# Patient Record
Sex: Male | Born: 1985 | Race: White | Hispanic: No | Marital: Married | Smoking: Never smoker
Health system: Southern US, Community
[De-identification: ages and names within clinical notes are randomized; demographics above are authoritative.]

## PROBLEM LIST (undated history)

## (undated) DIAGNOSIS — Z973 Presence of spectacles and contact lenses: Secondary | ICD-10-CM

## (undated) DIAGNOSIS — Z87820 Personal history of traumatic brain injury: Secondary | ICD-10-CM

## (undated) DIAGNOSIS — K219 Gastro-esophageal reflux disease without esophagitis: Secondary | ICD-10-CM

## (undated) DIAGNOSIS — Z87438 Personal history of other diseases of male genital organs: Secondary | ICD-10-CM

## (undated) HISTORY — PX: TONSILLECTOMY: SUR1361

## (undated) HISTORY — PX: WISDOM TOOTH EXTRACTION: SHX21

## (undated) HISTORY — PX: ORCHIOPEXY: SHX479

---
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
## 2014-11-12 ENCOUNTER — Other Ambulatory Visit: Payer: Self-pay | Admitting: Orthopedic Surgery

## 2014-11-12 DIAGNOSIS — M25572 Pain in left ankle and joints of left foot: Secondary | ICD-10-CM

## 2014-11-13 ENCOUNTER — Ambulatory Visit
Admission: RE | Admit: 2014-11-13 | Discharge: 2014-11-13 | Disposition: A | Discharge status: Home / Self Care | Payer: 59 | Source: Ambulatory Visit | Attending: Orthopedic Surgery | Admitting: Orthopedic Surgery

## 2014-11-13 DIAGNOSIS — M25572 Pain in left ankle and joints of left foot: Secondary | ICD-10-CM

## 2015-01-07 ENCOUNTER — Other Ambulatory Visit (HOSPITAL_COMMUNITY): Payer: Self-pay | Admitting: Orthopedic Surgery

## 2015-01-10 ENCOUNTER — Encounter (HOSPITAL_COMMUNITY)
Admission: RE | Admit: 2015-01-10 | Discharge: 2015-01-10 | Disposition: A | Discharge status: Home / Self Care | Payer: Commercial Managed Care - HMO | Source: Ambulatory Visit | Attending: Orthopedic Surgery | Admitting: Orthopedic Surgery

## 2015-01-10 ENCOUNTER — Encounter (HOSPITAL_COMMUNITY): Payer: Self-pay

## 2015-01-10 DIAGNOSIS — Q6689 Other  specified congenital deformities of feet: Secondary | ICD-10-CM | POA: Diagnosis not present

## 2015-01-10 DIAGNOSIS — X58XXXA Exposure to other specified factors, initial encounter: Secondary | ICD-10-CM | POA: Diagnosis not present

## 2015-01-10 DIAGNOSIS — S92002A Unspecified fracture of left calcaneus, initial encounter for closed fracture: Secondary | ICD-10-CM | POA: Diagnosis present

## 2015-01-10 LAB — BASIC METABOLIC PANEL
Anion gap: 8 (ref 5–15)
BUN: 17 mg/dL (ref 6–20)
CO2: 24 mmol/L (ref 22–32)
Calcium: 9.2 mg/dL (ref 8.9–10.3)
Chloride: 108 mmol/L (ref 101–111)
Creatinine, Ser: 1.12 mg/dL (ref 0.61–1.24)
GFR calc Af Amer: >60 mL/min (ref >60)
Glucose, Bld: 104 mg/dL — ABNORMAL HIGH (ref 65–99)
Potassium: 4.2 mmol/L (ref 3.5–5.1)
SODIUM: 140 mmol/L (ref 135–145)

## 2015-01-10 LAB — CBC
HCT: 45 % (ref 39.0–52.0)
Hemoglobin: 16.1 g/dL (ref 13.0–17.0)
MCH: 31.5 pg (ref 26.0–34.0)
MCHC: 35.8 g/dL (ref 30.0–36.0)
MCV: 88.1 fL (ref 78.0–100.0)
Platelets: 237 10*3/uL (ref 150–400)
RBC: 5.11 MIL/uL (ref 4.22–5.81)
RDW: 12.7 % (ref 11.5–15.5)
WBC: 7.2 10*3/uL (ref 4.0–10.5)

## 2015-01-10 LAB — SURGICAL PCR SCREEN
MRSA, PCR: NEGATIVE
STAPHYLOCOCCUS AUREUS: POSITIVE — AB

## 2015-01-10 MED ORDER — CEFAZOLIN SODIUM-DEXTROSE 2-3 GM-% IV SOLR
2.0000 g | INTRAVENOUS | Status: AC
  Administered 2015-01-11: 2 g via INTRAVENOUS
  Filled 2015-01-10: qty 50

## 2015-01-10 NOTE — Pre-Procedure Instructions (Signed)
    Ricky Adams  01/10/2015      [id number] - [place], [place] - [address] [place] [address] Phone: [phone] Fax: [phone]    Your procedure is scheduled on January 11, 2015.  Report to [facility] Admitting at 10:50 A.M.  Call this number if you have problems the morning of surgery:  [phone]   Remember:  Do not eat food or drink liquids after midnight.  Take these medicines the morning of surgery with A SIP OF WATER    Do not wear jewelry, make-up or nail polish.  Do not wear lotions, powders, or perfumes.  You may wear deodorant.  Do not shave 48 hours prior to surgery.  Men may shave face and neck.  Do not bring valuables to the hospital.  [facility] is not responsible for any belongings or valuables.  Contacts, dentures or bridgework may not be worn into surgery.  Leave your suitcase in the car.  After surgery it may be brought to your room.  For patients admitted to the hospital, discharge time will be determined by your treatment team.  Patients discharged the day of surgery will not be allowed to drive home.   Name and phone number of your driver:    Special instructions:  "PREPARING FOR SURGERY"  Please read over the following fact sheets that you were given. Pain Booklet, Coughing and Deep Breathing and Surgical Site Infection Prevention

## 2015-01-11 ENCOUNTER — Ambulatory Visit (HOSPITAL_COMMUNITY): Payer: Commercial Managed Care - HMO | Admitting: Anesthesiology

## 2015-01-11 ENCOUNTER — Encounter (HOSPITAL_COMMUNITY): Payer: Self-pay | Admitting: *Deleted

## 2015-01-11 ENCOUNTER — Ambulatory Visit (HOSPITAL_COMMUNITY)
Admission: RE | Admit: 2015-01-11 | Discharge: 2015-01-11 | Disposition: A | Discharge status: Home / Self Care | Payer: Commercial Managed Care - HMO | Source: Ambulatory Visit | Attending: Orthopedic Surgery | Admitting: Orthopedic Surgery

## 2015-01-11 ENCOUNTER — Encounter (HOSPITAL_COMMUNITY)
Admission: RE | Disposition: A | Discharge status: Home / Self Care | Payer: Self-pay | Source: Ambulatory Visit | Attending: Orthopedic Surgery

## 2015-01-11 DIAGNOSIS — M24672 Ankylosis, left ankle: Secondary | ICD-10-CM

## 2015-01-11 DIAGNOSIS — S92002A Unspecified fracture of left calcaneus, initial encounter for closed fracture: Secondary | ICD-10-CM | POA: Insufficient documentation

## 2015-01-11 DIAGNOSIS — X58XXXA Exposure to other specified factors, initial encounter: Secondary | ICD-10-CM | POA: Insufficient documentation

## 2015-01-11 DIAGNOSIS — Q6689 Other  specified congenital deformities of feet: Secondary | ICD-10-CM | POA: Diagnosis not present

## 2015-01-11 HISTORY — PX: ANKLE FUSION: SHX881

## 2015-01-11 SURGERY — ARTHRODESIS ANKLE
Anesthesia: Regional | Site: Foot | Laterality: Left

## 2015-01-11 MED ORDER — LIDOCAINE HCL (CARDIAC) 20 MG/ML IV SOLN
INTRAVENOUS | Status: AC
  Filled 2015-01-11: qty 5

## 2015-01-11 MED ORDER — FENTANYL CITRATE (PF) 100 MCG/2ML IJ SOLN
INTRAMUSCULAR | Status: AC
  Administered 2015-01-11: 50 ug
  Filled 2015-01-11: qty 2

## 2015-01-11 MED ORDER — PROMETHAZINE HCL 25 MG/ML IJ SOLN: 6.2500 mg | INTRAMUSCULAR | Status: DC | PRN

## 2015-01-11 MED ORDER — HYDROMORPHONE HCL 1 MG/ML IJ SOLN: 0.2500 mg | INTRAMUSCULAR | Status: DC | PRN

## 2015-01-11 MED ORDER — OXYCODONE HCL 5 MG/5ML PO SOLN: 5.0000 mg | Freq: Once | ORAL | Status: DC | PRN

## 2015-01-11 MED ORDER — MIDAZOLAM HCL 2 MG/2ML IJ SOLN
INTRAMUSCULAR | Status: AC
  Filled 2015-01-11: qty 4

## 2015-01-11 MED ORDER — MIDAZOLAM HCL 2 MG/2ML IJ SOLN
INTRAMUSCULAR | Status: AC
  Administered 2015-01-11: 2 mg
  Filled 2015-01-11: qty 2

## 2015-01-11 MED ORDER — 0.9 % SODIUM CHLORIDE (POUR BTL) OPTIME
TOPICAL | Status: DC | PRN
  Administered 2015-01-11: 1000 mL

## 2015-01-11 MED ORDER — OXYCODONE HCL 5 MG PO TABS: 5.0000 mg | ORAL_TABLET | Freq: Once | ORAL | Status: DC | PRN

## 2015-01-11 MED ORDER — HYDROCODONE-ACETAMINOPHEN 5-325 MG PO TABS: 1.0000 | ORAL_TABLET | Freq: Four times a day (QID) | ORAL | Status: DC | PRN

## 2015-01-11 MED ORDER — PROPOFOL 10 MG/ML IV BOLUS
INTRAVENOUS | Status: AC
  Filled 2015-01-11: qty 20

## 2015-01-11 MED ORDER — LACTATED RINGERS IV SOLN
Freq: Once | INTRAVENOUS | Status: AC
  Administered 2015-01-11: 11:00:00 via INTRAVENOUS

## 2015-01-11 MED ORDER — LACTATED RINGERS IV SOLN
INTRAVENOUS | Status: DC | PRN
  Administered 2015-01-11: 13:00:00 via INTRAVENOUS

## 2015-01-11 MED ORDER — ONDANSETRON HCL 4 MG/2ML IJ SOLN
INTRAMUSCULAR | Status: AC
  Filled 2015-01-11: qty 2

## 2015-01-11 MED ORDER — FENTANYL CITRATE (PF) 250 MCG/5ML IJ SOLN
INTRAMUSCULAR | Status: AC
  Filled 2015-01-11: qty 5

## 2015-01-11 MED ORDER — PROPOFOL 10 MG/ML IV BOLUS
INTRAVENOUS | Status: DC | PRN
  Administered 2015-01-11: 50 mg via INTRAVENOUS
  Administered 2015-01-11: 150 mg via INTRAVENOUS

## 2015-01-11 MED ORDER — ONDANSETRON HCL 4 MG/2ML IJ SOLN
INTRAMUSCULAR | Status: DC | PRN
  Administered 2015-01-11: 4 mg via INTRAVENOUS

## 2015-01-11 MED ORDER — KETOROLAC TROMETHAMINE 30 MG/ML IJ SOLN: 30.0000 mg | Freq: Once | INTRAMUSCULAR | Status: DC | PRN

## 2015-01-11 MED ORDER — MIDAZOLAM HCL 5 MG/5ML IJ SOLN
INTRAMUSCULAR | Status: DC | PRN
  Administered 2015-01-11: 2 mg via INTRAVENOUS

## 2015-01-11 SURGICAL SUPPLY — 50 items
BANDAGE ESMARK 6X9 LF (GAUZE/BANDAGES/DRESSINGS) IMPLANT
BLADE SAW SGTL 83.5X18.5 (BLADE) IMPLANT
BLADE SURG 10 STRL SS (BLADE) IMPLANT
BNDG COHESIVE 4X5 TAN STRL (GAUZE/BANDAGES/DRESSINGS) ×3 IMPLANT
BNDG ESMARK 4X9 LF (GAUZE/BANDAGES/DRESSINGS) ×3 IMPLANT
BNDG ESMARK 6X9 LF (GAUZE/BANDAGES/DRESSINGS)
BNDG GAUZE ELAST 4 BULKY (GAUZE/BANDAGES/DRESSINGS) ×3 IMPLANT
COTTON STERILE ROLL (GAUZE/BANDAGES/DRESSINGS) IMPLANT
COVER MAYO STAND STRL (DRAPES) ×3 IMPLANT
COVER SURGICAL LIGHT HANDLE (MISCELLANEOUS) ×3 IMPLANT
CUFF TOURNIQUET SINGLE 34IN LL (TOURNIQUET CUFF) IMPLANT
CUFF TOURNIQUET SINGLE 44IN (TOURNIQUET CUFF) IMPLANT
DRAPE INCISE IOBAN 66X45 STRL (DRAPES) IMPLANT
DRAPE OEC MINIVIEW 54X84 (DRAPES) IMPLANT
DRAPE PROXIMA HALF (DRAPES) ×3 IMPLANT
DRAPE U-SHAPE 47X51 STRL (DRAPES) ×3 IMPLANT
DRSG ADAPTIC 3X8 NADH LF (GAUZE/BANDAGES/DRESSINGS) ×3 IMPLANT
DRSG PAD ABDOMINAL 8X10 ST (GAUZE/BANDAGES/DRESSINGS) ×3 IMPLANT
DURAPREP 26ML APPLICATOR (WOUND CARE) ×3 IMPLANT
ELECT REM PT RETURN 9FT ADLT (ELECTROSURGICAL) ×3
ELECTRODE REM PT RTRN 9FT ADLT (ELECTROSURGICAL) ×1 IMPLANT
GAUZE SPONGE 4X4 12PLY STRL (GAUZE/BANDAGES/DRESSINGS) ×3 IMPLANT
GLOVE BIOGEL PI IND STRL 6.5 (GLOVE) ×1 IMPLANT
GLOVE BIOGEL PI IND STRL 9 (GLOVE) ×1 IMPLANT
GLOVE BIOGEL PI INDICATOR 6.5 (GLOVE) ×2
GLOVE BIOGEL PI INDICATOR 9 (GLOVE) ×2
GLOVE SURG ORTHO 9.0 STRL STRW (GLOVE) ×3 IMPLANT
GLOVE SURG SS PI 6.5 STRL IVOR (GLOVE) ×3 IMPLANT
GOWN STRL REUS W/ TWL LRG LVL3 (GOWN DISPOSABLE) ×1 IMPLANT
GOWN STRL REUS W/ TWL XL LVL3 (GOWN DISPOSABLE) ×1 IMPLANT
GOWN STRL REUS W/TWL LRG LVL3 (GOWN DISPOSABLE) ×2
GOWN STRL REUS W/TWL XL LVL3 (GOWN DISPOSABLE) ×2
KIT BASIN OR (CUSTOM PROCEDURE TRAY) ×3 IMPLANT
KIT ROOM TURNOVER OR (KITS) ×3 IMPLANT
MANIFOLD NEPTUNE II (INSTRUMENTS) IMPLANT
NS IRRIG 1000ML POUR BTL (IV SOLUTION) ×3 IMPLANT
PACK ORTHO EXTREMITY (CUSTOM PROCEDURE TRAY) ×3 IMPLANT
PAD ARMBOARD 7.5X6 YLW CONV (MISCELLANEOUS) ×3 IMPLANT
PAD CAST 4YDX4 CTTN HI CHSV (CAST SUPPLIES) IMPLANT
PADDING CAST COTTON 4X4 STRL (CAST SUPPLIES)
SPONGE LAP 18X18 X RAY DECT (DISPOSABLE) IMPLANT
STAPLER VISISTAT 35W (STAPLE) IMPLANT
SUCTION FRAZIER TIP 10 FR DISP (SUCTIONS) ×3 IMPLANT
SUT ETHILON 2 0 PSLX (SUTURE) ×3 IMPLANT
SUT VIC AB 2-0 CTB1 (SUTURE) IMPLANT
TOWEL OR 17X24 6PK STRL BLUE (TOWEL DISPOSABLE) IMPLANT
TOWEL OR 17X26 10 PK STRL BLUE (TOWEL DISPOSABLE) ×3 IMPLANT
TUBE CONNECTING 12'X1/4 (SUCTIONS) ×1
TUBE CONNECTING 12X1/4 (SUCTIONS) ×2 IMPLANT
WATER STERILE IRR 1000ML POUR (IV SOLUTION) IMPLANT

## 2015-01-11 NOTE — Progress Notes (Signed)
Orthopedic Tech Progress Note Patient Details:  Ricky Adams [facility] 1985/10/20 [id number]  Ortho Devices Type of Ortho Device: Postop shoe/boot, Crutches Ortho Device/Splint Location: lle Ortho Device/Splint Interventions: Application   [name] 01/11/2015, 2:25 PM

## 2015-01-11 NOTE — Transfer of Care (Signed)
Immediate Anesthesia Transfer of Care Note  Patient: Ricky Adams  Procedure(s) Performed: Procedure(s): Excision Left Subtalar Bar (Left)  Patient Location: PACU  Anesthesia Type:General  Level of Consciousness: awake, alert  and oriented  Airway & Oxygen Therapy: Patient Spontanous Breathing and Patient connected to nasal cannula oxygen  Post-op Assessment: Report given to RN  Post vital signs: Reviewed and stable  Last Vitals:  Filed Vitals:   01/11/15 1219  BP:   Pulse: 82  Temp:   Resp: 13    Complications: No apparent anesthesia complications

## 2015-01-11 NOTE — Progress Notes (Signed)
Orthopedic Tech Progress Note Patient Details:  Ricky Adams 08/08/1985 [id number]  Patient ID: Ricky Adams, male   DOB: 05-09-1986, 29 y.o.   MRN: [id number] Viewed order from doctor's order list  Nikki Dom 01/11/2015, 2:26 PM

## 2015-01-11 NOTE — Anesthesia Procedure Notes (Addendum)
Anesthesia Regional Block:  Popliteal block  Pre-Anesthetic Checklist: ,, timeout performed, Correct Patient, Correct Site, Correct Laterality, Correct Procedure, Correct Position, site marked, Risks and benefits discussed,  Surgical consent,  Pre-op evaluation,  At surgeon's request and post-op pain management  Laterality: Left  Prep: chloraprep       Needles:  Injection technique: Single-shot  Needle Type: Echogenic Stimulator Needle     Needle Length: 9cm 9 cm Needle Gauge: 21 and 21 G    Additional Needles:  Procedures: ultrasound guided (picture in chart) and nerve stimulator Popliteal block  Nerve Stimulator or Paresthesia:  Response: tibial, 0.5 mA,  Response: peroneal 0.5,   Additional Responses:   Narrative:  Start time: 01/11/2015 11:55 AM End time: 01/11/2015 12:03 PM Injection made incrementally with aspirations every 5 mL.  Performed by: Personally  Anesthesiologist: Marcene Duos  Additional Notes: Risks and benefits discussed. Pt tolerated well with no immediate complications.   Procedure Name: LMA Insertion Date/Time: 01/11/2015 12:48 PM Performed by: Jefm Miles E Pre-anesthesia Checklist: Patient identified, Emergency Drugs available, Suction available, Patient being monitored and Timeout performed Patient Re-evaluated:Patient Re-evaluated prior to inductionOxygen Delivery Method: Circle system utilized Preoxygenation: Pre-oxygenation with 100% oxygen Intubation Type: IV induction Ventilation: Mask ventilation without difficulty LMA: LMA inserted LMA Size: 5.0 Number of attempts: 1 Placement Confirmation: positive ETCO2 and breath sounds checked- equal and bilateral Tube secured with: Tape Dental Injury: Teeth and Oropharynx as per pre-operative assessment

## 2015-01-11 NOTE — H&P (Signed)
Ricky Adams is an 29 y.o. male.   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  No past medical history on file.  Past Surgical History  Procedure Laterality Date  . Wisdom tooth extraction    . Tonsillectomy      No family history on file. Social History:  has no tobacco, alcohol, and drug history on file.  Allergies: No Known Allergies  No prescriptions prior to admission    Results for orders placed or performed during the hospital encounter of 01/10/15 (from the past 48 hour(s))  CBC     Status: None   Collection Time: 01/10/15  3:35 PM  Result Value Ref Range   WBC 7.2 4.0 - 10.5 K/uL   RBC 5.11 4.22 - 5.81 MIL/uL   Hemoglobin 16.1 13.0 - 17.0 g/dL   HCT 45.0 39.0 - 52.0 %   MCV 88.1 78.0 - 100.0 fL   MCH 31.5 26.0 - 34.0 pg   MCHC 35.8 30.0 - 36.0 g/dL   RDW 12.7 11.5 - 15.5 %   Platelets 237 150 - 400 K/uL  Basic metabolic panel     Status: Abnormal   Collection Time: 01/10/15  3:35 PM  Result Value Ref Range   Sodium 140 135 - 145 mmol/L   Potassium 4.2 3.5 - 5.1 mmol/L   Chloride 108 101 - 111 mmol/L   CO2 24 22 - 32 mmol/L   Glucose, Bld 104 (H) 65 - 99 mg/dL   BUN 17 6 - 20 mg/dL   Creatinine, Ser 1.12 0.61 - 1.24 mg/dL   Calcium 9.2 8.9 - 10.3 mg/dL   GFR calc non Af Amer >60 >60 mL/min   GFR calc Af Amer >60 >60 mL/min    Comment: (NOTE) The eGFR has been calculated using the CKD EPI equation. This calculation has not been validated in all clinical situations. eGFR's persistently <60 mL/min signify possible Chronic Kidney Disease.    Anion gap 8 5 - 15  Surgical pcr screen     Status: Abnormal   Collection Time: 01/10/15  3:35 PM  Result Value Ref Range   MRSA, PCR NEGATIVE NEGATIVE   Staphylococcus aureus POSITIVE (A) NEGATIVE    Comment:        The Xpert SA Assay  (FDA approved for NASAL specimens in patients over 4 years of age), is one component of a comprehensive surveillance program.  Test performance has been validated by [facility] for patients greater than or equal to 67 year old. It is not intended to diagnose infection nor to guide or monitor treatment.    No results found.  Review of Systems  All other systems reviewed and are negative.   There were no vitals taken for this visit. Physical Exam  Patient has essentially no subtalar motion. Review of radiographs and CT scan shows a subtalar bar. Assessment/Plan Assessment: Painful subtalar bar left foot.  Plan: We'll plan for excision of the subtalar bar left foot.  Ricky Adams V 01/11/2015, 6:15 AM

## 2015-01-11 NOTE — Anesthesia Preprocedure Evaluation (Addendum)
Anesthesia Evaluation  Patient identified by MRN, date of birth, ID band Patient awake    Reviewed: Allergy & Precautions, NPO status , Patient's Chart, lab work & pertinent test results  Airway Mallampati: I  TM Distance: >3 FB Neck ROM: Full    Dental  (+) Teeth Intact, Dental Advisory Given   Pulmonary neg pulmonary ROS,  breath sounds clear to auscultation        Cardiovascular negative cardio ROS  Rhythm:Regular Rate:Normal     Neuro/Psych negative neurological ROS     GI/Hepatic negative GI ROS, Neg liver ROS,   Endo/Other  negative endocrine ROS  Renal/GU negative Renal ROS     Musculoskeletal   Abdominal   Peds  Hematology negative hematology ROS (+)   Anesthesia Other Findings   Reproductive/Obstetrics                            Anesthesia Physical Anesthesia Plan  ASA: I  Anesthesia Plan: General and Regional   Post-op Pain Management: GA combined w/ Regional for post-op pain   Induction: Intravenous  Airway Management Planned: LMA  Additional Equipment:   Intra-op Plan:   Post-operative Plan:   Informed Consent: I have reviewed the patients History and Physical, chart, labs and discussed the procedure including the risks, benefits and alternatives for the proposed anesthesia with the patient or authorized representative who has indicated his/her understanding and acceptance.     Plan Discussed with: CRNA  Anesthesia Plan Comments:         Anesthesia Quick Evaluation

## 2015-01-11 NOTE — Anesthesia Postprocedure Evaluation (Signed)
  Anesthesia Post-op Note  Patient: Ricky Adams  Procedure(s) Performed: Procedure(s): Excision Left Subtalar Bar (Left)  Patient Location: PACU  Anesthesia Type:GA combined with regional for post-op pain  Level of Consciousness: awake, alert  and oriented  Airway and Oxygen Therapy: Patient Spontanous Breathing  Post-op Pain: none  Post-op Assessment: Post-op Vital signs reviewed              Post-op Vital Signs: Reviewed  Last Vitals:  Filed Vitals:   01/11/15 1415  BP: 129/95  Pulse: 74  Temp: 36.1 C  Resp: 16    Complications: No apparent anesthesia complications

## 2015-01-11 NOTE — Op Note (Signed)
01/11/2015  1:27 PM  PATIENT:  Ricky Adams    PRE-OPERATIVE DIAGNOSIS:  Calcaneonavicular Bar   POST-OPERATIVE DIAGNOSIS:  Same  PROCEDURE:  Excision Left Subtalar Bar  SURGEON:  DUDA,MARCUS V, MD  PHYSICIAN ASSISTANT:None ANESTHESIA:   General  PREOPERATIVE INDICATIONS:  Ricky Adams is a  29 y.o. male with a diagnosis of Calcaneonavicular Bar with Calcaneus Fracture who failed conservative measures and elected for surgical management.    The risks benefits and alternatives were discussed with the patient preoperatively including but not limited to the risks of infection, bleeding, nerve injury, cardiopulmonary complications, the need for revision surgery, among others, and the patient was willing to proceed.  OPERATIVE IMPLANTS: None  OPERATIVE FINDINGS: Bony fusion of the calcaneus to the navicular  OPERATIVE PROCEDURE: Patient was brought to the operating room and underwent a general and aesthetic. After adequate levels of anesthesia were obtained patient's left lower extremity was prepped using DuraPrep draped into a sterile field. A timeout was called. An oblique incision was made over the sinus Tarsi. The extensor muscles were elevated off the talar neck. The talar neck was identified and osteotome was used to resect the subtalar bar. After resection patient had good subtalar motion. The wound was irrigated with normal saline. The incision was closed using 2-0 nylon. A sterile compressive dressing was applied. Patient was extubated taken to the PACU in stable condition plan for discharge to home.

## 2015-01-12 ENCOUNTER — Encounter (HOSPITAL_COMMUNITY): Payer: Self-pay | Admitting: Orthopedic Surgery

## 2018-01-13 ENCOUNTER — Other Ambulatory Visit: Payer: Self-pay | Admitting: Urology

## 2018-01-22 ENCOUNTER — Encounter (HOSPITAL_BASED_OUTPATIENT_CLINIC_OR_DEPARTMENT_OTHER): Payer: Self-pay | Admitting: *Deleted

## 2018-01-24 ENCOUNTER — Encounter (HOSPITAL_BASED_OUTPATIENT_CLINIC_OR_DEPARTMENT_OTHER): Payer: Self-pay | Admitting: *Deleted

## 2018-01-24 ENCOUNTER — Other Ambulatory Visit: Payer: Self-pay

## 2018-01-24 NOTE — Progress Notes (Signed)
Spoke w/ pt via phone for pre-op interview.  Npo after mn w/ exception clear liquids until 0630 (no cream /milk products).  Arrive at 1030.   

## 2018-01-31 ENCOUNTER — Encounter (HOSPITAL_BASED_OUTPATIENT_CLINIC_OR_DEPARTMENT_OTHER)
Admission: RE | Disposition: A | Discharge status: Home / Self Care | Payer: Self-pay | Source: Ambulatory Visit | Attending: Urology

## 2018-01-31 ENCOUNTER — Ambulatory Visit (HOSPITAL_BASED_OUTPATIENT_CLINIC_OR_DEPARTMENT_OTHER)
Admission: RE | Admit: 2018-01-31 | Discharge: 2018-01-31 | Disposition: A | Discharge status: Home / Self Care | Payer: 59 | Source: Ambulatory Visit | Attending: Urology | Admitting: Urology

## 2018-01-31 ENCOUNTER — Encounter (HOSPITAL_BASED_OUTPATIENT_CLINIC_OR_DEPARTMENT_OTHER): Payer: Self-pay | Admitting: Certified Registered Nurse Anesthetist

## 2018-01-31 ENCOUNTER — Ambulatory Visit (HOSPITAL_BASED_OUTPATIENT_CLINIC_OR_DEPARTMENT_OTHER): Payer: 59 | Admitting: Certified Registered Nurse Anesthetist

## 2018-01-31 DIAGNOSIS — K219 Gastro-esophageal reflux disease without esophagitis: Secondary | ICD-10-CM | POA: Insufficient documentation

## 2018-01-31 DIAGNOSIS — N448 Other noninflammatory disorders of the testis: Secondary | ICD-10-CM | POA: Insufficient documentation

## 2018-01-31 DIAGNOSIS — Z79899 Other long term (current) drug therapy: Secondary | ICD-10-CM | POA: Diagnosis not present

## 2018-01-31 DIAGNOSIS — Z302 Encounter for sterilization: Secondary | ICD-10-CM | POA: Insufficient documentation

## 2018-01-31 DIAGNOSIS — N500 Atrophy of testis: Secondary | ICD-10-CM | POA: Diagnosis not present

## 2018-01-31 HISTORY — DX: Presence of spectacles and contact lenses: Z97.3

## 2018-01-31 HISTORY — DX: Gastro-esophageal reflux disease without esophagitis: K21.9

## 2018-01-31 HISTORY — DX: Personal history of other diseases of male genital organs: Z87.438

## 2018-01-31 HISTORY — PX: VASECTOMY: SHX75

## 2018-01-31 HISTORY — DX: Personal history of traumatic brain injury: Z87.820

## 2018-01-31 SURGERY — VASECTOMY
Anesthesia: General | Site: Scrotum | Laterality: Bilateral

## 2018-01-31 MED ORDER — ONDANSETRON HCL 4 MG/2ML IJ SOLN
INTRAMUSCULAR | Status: DC | PRN
  Administered 2018-01-31: 4 mg via INTRAVENOUS

## 2018-01-31 MED ORDER — ACETAMINOPHEN 500 MG PO TABS
ORAL_TABLET | ORAL | Status: AC
  Filled 2018-01-31: qty 2

## 2018-01-31 MED ORDER — LIDOCAINE 2% (20 MG/ML) 5 ML SYRINGE
INTRAMUSCULAR | Status: AC
  Filled 2018-01-31: qty 5

## 2018-01-31 MED ORDER — SODIUM CHLORIDE 0.9 % IR SOLN
Status: DC | PRN
  Administered 2018-01-31: 500 mL

## 2018-01-31 MED ORDER — FENTANYL CITRATE (PF) 100 MCG/2ML IJ SOLN
INTRAMUSCULAR | Status: AC
  Filled 2018-01-31: qty 2

## 2018-01-31 MED ORDER — CEFAZOLIN SODIUM-DEXTROSE 2-4 GM/100ML-% IV SOLN
2.0000 g | INTRAVENOUS | Status: AC
  Administered 2018-01-31: 2 g via INTRAVENOUS
  Filled 2018-01-31: qty 100

## 2018-01-31 MED ORDER — LACTATED RINGERS IV SOLN
INTRAVENOUS | Status: DC
  Administered 2018-01-31: 12:00:00 via INTRAVENOUS
  Filled 2018-01-31: qty 1000

## 2018-01-31 MED ORDER — KETOROLAC TROMETHAMINE 30 MG/ML IJ SOLN
INTRAMUSCULAR | Status: DC | PRN
  Administered 2018-01-31: 30 mg via INTRAVENOUS

## 2018-01-31 MED ORDER — PROPOFOL 10 MG/ML IV BOLUS
INTRAVENOUS | Status: DC | PRN
  Administered 2018-01-31: 200 mg via INTRAVENOUS

## 2018-01-31 MED ORDER — FENTANYL CITRATE (PF) 100 MCG/2ML IJ SOLN
INTRAMUSCULAR | Status: DC | PRN
  Administered 2018-01-31 (×5): 25 ug via INTRAVENOUS

## 2018-01-31 MED ORDER — PROMETHAZINE HCL 25 MG/ML IJ SOLN
6.2500 mg | INTRAMUSCULAR | Status: DC | PRN
  Filled 2018-01-31: qty 1

## 2018-01-31 MED ORDER — LIDOCAINE 2% (20 MG/ML) 5 ML SYRINGE
INTRAMUSCULAR | Status: DC | PRN
  Administered 2018-01-31: 100 mg via INTRAVENOUS

## 2018-01-31 MED ORDER — BUPIVACAINE HCL (PF) 0.25 % IJ SOLN
INTRAMUSCULAR | Status: DC | PRN
  Administered 2018-01-31: 10 mL

## 2018-01-31 MED ORDER — FENTANYL CITRATE (PF) 100 MCG/2ML IJ SOLN
25.0000 ug | INTRAMUSCULAR | Status: DC | PRN
  Filled 2018-01-31: qty 1

## 2018-01-31 MED ORDER — CEFAZOLIN SODIUM-DEXTROSE 2-4 GM/100ML-% IV SOLN
INTRAVENOUS | Status: AC
  Filled 2018-01-31: qty 100

## 2018-01-31 MED ORDER — ONDANSETRON HCL 4 MG/2ML IJ SOLN
INTRAMUSCULAR | Status: AC
  Filled 2018-01-31: qty 2

## 2018-01-31 MED ORDER — MIDAZOLAM HCL 5 MG/5ML IJ SOLN
INTRAMUSCULAR | Status: DC | PRN
  Administered 2018-01-31: 2 mg via INTRAVENOUS

## 2018-01-31 MED ORDER — DEXAMETHASONE SODIUM PHOSPHATE 10 MG/ML IJ SOLN
INTRAMUSCULAR | Status: AC
  Filled 2018-01-31: qty 1

## 2018-01-31 MED ORDER — ACETAMINOPHEN 500 MG PO TABS
1000.0000 mg | ORAL_TABLET | Freq: Once | ORAL | Status: AC
  Administered 2018-01-31: 1000 mg via ORAL
  Filled 2018-01-31: qty 2

## 2018-01-31 MED ORDER — MIDAZOLAM HCL 2 MG/2ML IJ SOLN
INTRAMUSCULAR | Status: AC
  Filled 2018-01-31: qty 2

## 2018-01-31 MED ORDER — DEXAMETHASONE SODIUM PHOSPHATE 10 MG/ML IJ SOLN
INTRAMUSCULAR | Status: DC | PRN
  Administered 2018-01-31: 10 mg via INTRAVENOUS

## 2018-01-31 MED ORDER — TRAMADOL HCL 50 MG PO TABS: 50.0000 mg | ORAL_TABLET | Freq: Four times a day (QID) | ORAL | 0 refills | Status: AC | PRN

## 2018-01-31 SURGICAL SUPPLY — 34 items
BLADE SURG 15 STRL LF DISP TIS (BLADE) ×1 IMPLANT
BLADE SURG 15 STRL SS (BLADE) ×2
BNDG GAUZE ELAST 4 BULKY (GAUZE/BANDAGES/DRESSINGS) ×3 IMPLANT
COVER BACK TABLE 60X90IN (DRAPES) ×3 IMPLANT
COVER MAYO STAND STRL (DRAPES) ×3 IMPLANT
DERMABOND ADVANCED (GAUZE/BANDAGES/DRESSINGS) ×2
DERMABOND ADVANCED .7 DNX12 (GAUZE/BANDAGES/DRESSINGS) ×1 IMPLANT
DRAPE LAPAROTOMY 100X72 PEDS (DRAPES) ×3 IMPLANT
ELECT NEEDLE TIP 2.8 STRL (NEEDLE) ×3 IMPLANT
ELECT REM PT RETURN 9FT ADLT (ELECTROSURGICAL) ×3
ELECTRODE REM PT RTRN 9FT ADLT (ELECTROSURGICAL) ×1 IMPLANT
GLOVE BIO SURGEON STRL SZ 6.5 (GLOVE) ×2 IMPLANT
GLOVE BIO SURGEON STRL SZ8 (GLOVE) ×3 IMPLANT
GLOVE BIO SURGEONS STRL SZ 6.5 (GLOVE) ×1
GLOVE BIOGEL PI IND STRL 6.5 (GLOVE) ×1 IMPLANT
GLOVE BIOGEL PI IND STRL 7.5 (GLOVE) ×1 IMPLANT
GLOVE BIOGEL PI INDICATOR 6.5 (GLOVE) ×2
GLOVE BIOGEL PI INDICATOR 7.5 (GLOVE) ×2
GOWN STRL REUS W/ TWL XL LVL3 (GOWN DISPOSABLE) ×2 IMPLANT
GOWN STRL REUS W/TWL XL LVL3 (GOWN DISPOSABLE) ×4
KIT TURNOVER CYSTO (KITS) ×3 IMPLANT
NEEDLE HYPO 25X1 1.5 SAFETY (NEEDLE) IMPLANT
NS IRRIG 500ML POUR BTL (IV SOLUTION) ×3 IMPLANT
PACK BASIN DAY SURGERY FS (CUSTOM PROCEDURE TRAY) ×3 IMPLANT
PENCIL BUTTON HOLSTER BLD 10FT (ELECTRODE) ×3 IMPLANT
SUPPORT SCROTAL LG STRP (MISCELLANEOUS) ×4 IMPLANT
SUPPORTER ATHLETIC LG (MISCELLANEOUS) ×2
SUT CHROMIC 3 0 PS 2 (SUTURE) ×3 IMPLANT
SUT SILK 2 0 (SUTURE) ×2
SUT SILK 2-0 18XBRD TIE 12 (SUTURE) ×1 IMPLANT
SYR CONTROL 10ML LL (SYRINGE) ×3 IMPLANT
TOWEL OR 17X24 6PK STRL BLUE (TOWEL DISPOSABLE) ×3 IMPLANT
TRAY DSU PREP LF (CUSTOM PROCEDURE TRAY) ×3 IMPLANT
WATER STERILE IRR 500ML POUR (IV SOLUTION) IMPLANT

## 2018-01-31 NOTE — Anesthesia Preprocedure Evaluation (Signed)
Anesthesia Evaluation  Patient identified by MRN, date of birth, ID band Patient awake    Reviewed: Allergy & Precautions, NPO status , Patient's Chart, lab work & pertinent test results  Airway Mallampati: II  TM Distance: >3 FB Neck ROM: Full    Dental  (+) Teeth Intact, Dental Advisory Given   Pulmonary neg pulmonary ROS,    Pulmonary exam normal breath sounds clear to auscultation       Cardiovascular negative cardio ROS Normal cardiovascular exam Rhythm:Regular Rate:Normal     Neuro/Psych negative neurological ROS  negative psych ROS   GI/Hepatic Neg liver ROS, GERD  ,  Endo/Other  negative endocrine ROS  Renal/GU negative Renal ROS     Musculoskeletal negative musculoskeletal ROS (+)   Abdominal   Peds  Hematology negative hematology ROS (+)   Anesthesia Other Findings Day of surgery medications reviewed with the patient.  Reproductive/Obstetrics                             Anesthesia Physical Anesthesia Plan  ASA: I  Anesthesia Plan: General   Post-op Pain Management:    Induction: Intravenous  PONV Risk Score and Plan: 4 or greater and Midazolam, Dexamethasone and Ondansetron  Airway Management Planned: LMA  Additional Equipment:   Intra-op Plan:   Post-operative Plan: Extubation in OR  Informed Consent: I have reviewed the patients History and Physical, chart, labs and discussed the procedure including the risks, benefits and alternatives for the proposed anesthesia with the patient or authorized representative who has indicated his/her understanding and acceptance.   Dental advisory given  Plan Discussed with: CRNA  Anesthesia Plan Comments:         Anesthesia Quick Evaluation

## 2018-01-31 NOTE — Transfer of Care (Signed)
Immediate Anesthesia Transfer of Care Note  Patient: Ricky Adams  Procedure(s) Performed: VASECTOMY (Bilateral Scrotum)  Patient Location: PACU  Anesthesia Type:General  Level of Consciousness: drowsy and patient cooperative  Airway & Oxygen Therapy: Patient Spontanous Breathing and Patient connected to nasal cannula oxygen  Post-op Assessment: Report given to RN and Post -op Vital signs reviewed and stable  Post vital signs: Reviewed and stable  Last Vitals:  Vitals Value Taken Time  BP    Temp    Pulse    Resp    SpO2      Last Pain:  Vitals:   01/31/18 1039  TempSrc: Oral      Patients Stated Pain Goal: 5 (01/31/18 1118)  Complications: No apparent anesthesia complications

## 2018-01-31 NOTE — Discharge Instructions (Signed)
Vasectomy, Care After °Refer to this sheet in the next few weeks. These instructions provide you with information on caring for yourself after your procedure. Your health care provider may also give you more specific instructions. Your treatment has been planned according to current medical practices, but problems sometimes occur. Call your health care provider if you have any problems or questions after your procedure. °What can I expect after the procedure? °After your procedure, it is typical to have the following: °· Slight swelling or redness or both at the surgical site. °· Mild pain or discomfort in the scrotum. °· Some oozing of blood from the cuts (incisions) made by the surgeon is normal during the first day or two after the procedure. °· Blood in the ejaculate is common and typically clears after a few days. ° °Follow these instructions at home: °· Only take over-the-counter or prescription medicines for pain, discomfort, or fever as directed by your health care provider. °· Avoid using nonsteroidal anti-inflammatory drugs (NSAIDs) because these can make bleeding worse. °· Apply ice to the injured area: °? Put ice in a plastic bag. °? Place a towel between your skin and the bag. °? Leave the ice on for 20 minutes, 2-3 times a day. °· Avoid being active for the first 2 days after surgery. °· Wear a supporter while moving around for the first week after surgery. You may add some sterile fluffed bandages or a clean washcloth to the scrotal support if the scrotal support irritates your skin. °· Do not participate in sports or perform heavy physical labor for at least 2 weeks. °· You may have protected intercourse 7-10 days after your procedure. Remember, you are not sterile until follow-up specimens show no sperm in your ejaculate. °· Be sure to follow up with your surgeon as instructed to confirm sterility. It usually requires multiple ejaculations to clear the sperm located beyond the vasectomy site of  blockage. You will need at least two specimens showing an absence of sperm before you can resume unprotected intercourse. °Contact a health care provider if: °· You have redness, swelling, or increasing pain in the wounds or testicles (scrotum). °· You see pus coming from the wound. °· You have a fever. °· You notice a foul smell coming from the wound or dressing. °· You notice a breaking open of the stitches (suture) line or wound edges even after sutures have been removed. °· You have increased bleeding from the wounds. °Get help right away if: °· You develop a rash. °· You have difficulty breathing. °· You have any reaction or side effects to medicines given. °This information is not intended to replace advice given to you by your health care provider. Make sure you discuss any questions you have with your health care provider. °Document Released: 12/15/2004 Document Revised: 11/03/2015 Document Reviewed: 12/15/2012 °Elsevier Interactive Patient Education © 2018 [employer] ° °Post Anesthesia Home Care Instructions ° °Activity: °Get plenty of rest for the remainder of the day. A responsible individual must stay with you for 24 hours following the procedure.  °For the next 24 hours, DO NOT: °-Drive a car °-Operate machinery °-Drink alcoholic beverages °-Take any medication unless instructed by your physician °-Make any legal decisions or sign important papers. ° °Meals: °Start with liquid foods such as gelatin or soup. Progress to regular foods as tolerated. Avoid greasy, spicy, heavy foods. If nausea and/or vomiting occur, drink only clear liquids until the nausea and/or vomiting subsides. Call your physician if vomiting continues. ° °  Special Instructions/Symptoms: °Your throat may feel dry or sore from the anesthesia or the breathing tube placed in your throat during surgery. If this causes discomfort, gargle with warm salt water. The discomfort should disappear within 24 hours. ° °If you had a scopolamine  patch placed behind your ear for the management of post- operative nausea and/or vomiting: ° °1. The medication in the patch is effective for 72 hours, after which it should be removed.  Wrap patch in a tissue and discard in the trash. Wash hands thoroughly with soap and water. °2. You may remove the patch earlier than 72 hours if you experience unpleasant side effects which may include dry mouth, dizziness or visual disturbances. °3. Avoid touching the patch. Wash your hands with soap and water after contact with the patch. °  ° °

## 2018-01-31 NOTE — Anesthesia Postprocedure Evaluation (Signed)
Anesthesia Post Note  Patient: Ricky Adams  Procedure(s) Performed: VASECTOMY (Bilateral Scrotum)     Patient location during evaluation: PACU Anesthesia Type: General Level of consciousness: awake and alert, awake and oriented Pain management: pain level controlled Vital Signs Assessment: post-procedure vital signs reviewed and stable Respiratory status: spontaneous breathing, nonlabored ventilation and respiratory function stable Cardiovascular status: blood pressure returned to baseline and stable Postop Assessment: no apparent nausea or vomiting Anesthetic complications: no    Last Vitals:  Vitals:   01/31/18 1345 01/31/18 1400  BP: (!) 144/97 (!) 139/97  Pulse: (!) 101 83  Resp: 17 15  Temp:    SpO2: 98% 95%    Last Pain:  Vitals:   01/31/18 1335  TempSrc:   PainSc: Asleep                 Cecile HearingStephen Edward Tanasha Menees

## 2018-01-31 NOTE — Op Note (Signed)
Preoperative diagnosis: Desires Sterilization Postop diagnosis: Same  Procedure: 1.  Vasectomy  Attending: Wilkie AyePatrick Mckenzie  Anesthesia: General  Estimated blood loss: 5 cc  Drains: 1. 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 Right high riding testis with dense fibrosis around vas  Indications: Patient is a 32 year old who desires sterilization. After discussing options he desires to proceed with vasectomy.  Procedure in detail: Prior to procedure consent was obtained. Patient was brought to the operating room and briefing was done sure correct patient, correct procedure, correct site.  General anesthesia was in administered patient was placed in the supine position.  The patients genetalia was prepped and draped in the usual, sterile fashion.  A 0.5 cm incision was made in the left hemiscrotum.  The vas was then brought to the level of the incision.  Then using a vas grasper we isolated the vas deferens.  We then used electrocautery to free the vas from the perivasal tissue.  Once the vas was isolated we then placed a clamp on either side of the vas.  Then sharply incised the vas and removed a 1 cm section of vas deferens.  We then cauterized the ends of the vein as and then ligated the vessels with 0 silk suture.  We then placed the individual limbs of vas deferens and separate compartments.  We closed the overlying skin with 3-0 chromic in interrupted fashion.  A similar technique was used on the left side to isolate the left vas.  Left vas was ligated in a similar fashion.  Good hemostasis was also noted in the left hemiscrotum.  We then returned the ends of the vas to separate compartments.  We then closed the overlying skin with 3-0 chromic in an interrupted fashion. This then concluded the procedure which was well tolerated by the patient. Complications: None Condition: Stable, extubated, transferred to PACU. Plan:  Patient is to be discharged home and followup in 8 and 12 weeks with a semen sample

## 2018-01-31 NOTE — Anesthesia Procedure Notes (Signed)
Procedure Name: LMA Insertion Date/Time: 01/31/2018 12:50 PM Performed by: Pearson Grippeobertson, Mayte Diers M, CRNA Pre-anesthesia Checklist: Patient identified, Emergency Drugs available, Suction available and Patient being monitored Patient Re-evaluated:Patient Re-evaluated prior to induction Oxygen Delivery Method: Circle system utilized Preoxygenation: Pre-oxygenation with 100% oxygen Induction Type: IV induction Ventilation: Mask ventilation without difficulty LMA: LMA inserted LMA Size: 4.0 Number of attempts: 1 Placement Confirmation: positive ETCO2 Tube secured with: Tape Dental Injury: Teeth and Oropharynx as per pre-operative assessment

## 2018-01-31 NOTE — H&P (Signed)
Urology Admission H&P  Chief Complaint: desires sterilization  History of Present Illness: Mr Agustina CaroliMcCuiston is a 32yo with a hx of UDT and atrophic testis who desires sterilization. No current testicular pain  Past Medical History:  Diagnosis Date  . GERD (gastroesophageal reflux disease)   . History of concussion    01-24-2018  per pt x3 , last one 2005,  no residuals  . 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 Wears contact lenses    Past Surgical History:  Procedure Laterality Date  . ANKLE FUSION Left 01/11/2015   Procedure: Excision Left Subtalar Bar;  Surgeon: Nadara MustardMarcus Duda V, MD;  [facility];  Service: Orthopedics;  Laterality: Left;  . ORCHIOPEXY Bilateral child   for undescended testis  . TONSILLECTOMY  child  . WISDOM TOOTH EXTRACTION  teen    Home Medications:  Current Facility-Administered Medications  Medication Dose Route Frequency Provider Last Rate Last Dose  . ceFAZolin (ANCEF) IVPB 2g/100 mL premix  2 g Intravenous 30 min Pre-Op Ronne BinningMcKenzie, Mardene CelestePatrick L, MD      . lactated ringers infusion   Intravenous Continuous Cecile Hearingurk, Stephen Edward, MD 50 mL/hr at 01/31/18 1133    . sodium chloride irrigation 0.9 %    PRN Malen GauzeMcKenzie, Saniah Schroeter L, MD   500 mL at 01/31/18 1224   Allergies: No Known Allergies  History reviewed. No pertinent family history. Social History:  reports that he has never smoked. He has never used smokeless tobacco. He reports that he drinks alcohol. He reports that he does not use drugs.  Review of Systems  All other systems reviewed and are negative.   Physical Exam:  Vital signs in last 24 hours: Temp:  [98.4 F (36.9 C)] 98.4 F (36.9 C) (08/23 1039) Pulse Rate:  [82] 82 (08/23 1039) Resp:  [18] 18 (08/23 1039) BP: (149)/(96) 149/96 (08/23 1039) SpO2:  [100 %] 100 % (08/23 1039) Weight:  [76.4 kg] 76.4 kg (08/23 1039) Physical Exam  Constitutional: He is oriented to person, place, and time. He appears well-developed  and well-nourished.  HENT:  Head: Normocephalic and atraumatic.  Eyes: Pupils are equal, round, and reactive to light. EOM are normal.  Neck: Normal range of motion. No thyromegaly present.  Cardiovascular: Normal rate and regular rhythm.  Respiratory: Effort normal. No respiratory distress.  GI: Soft. He exhibits no distension.  Musculoskeletal: Normal range of motion. He exhibits no edema.  Neurological: He is alert and oriented to person, place, and time.  Skin: Skin is warm and dry.  Psychiatric: He has a normal mood and affect. His behavior is normal. Judgment and thought content normal.    Laboratory Data:  No results found for this or any previous visit (from the past 24 hour(s)). No results found for this or any previous visit (from the past 240 hour(s)). Creatinine: No results for input(s): CREATININE in the last 168 hours. Baseline Creatinine: unknown  Impression/Assessment:  32yo who desires sterilization  Plan:  The risks/benefits/alternatives to vasectomy was explained to the patient and he understands and wishes to proceed with surgery  Wilkie Ayeatrick Kalah Pflum 01/31/2018, 12:39 PM

## 2018-02-03 ENCOUNTER — Encounter (HOSPITAL_BASED_OUTPATIENT_CLINIC_OR_DEPARTMENT_OTHER): Payer: Self-pay | Admitting: Urology

## 2018-10-21 ENCOUNTER — Ambulatory Visit: Payer: 59 | Admitting: Orthopedic Surgery

## 2018-10-30 ENCOUNTER — Ambulatory Visit: Payer: 59

## 2018-10-30 ENCOUNTER — Ambulatory Visit: Payer: 59 | Admitting: Orthopedic Surgery

## 2018-10-30 ENCOUNTER — Other Ambulatory Visit: Payer: Self-pay

## 2018-10-30 ENCOUNTER — Encounter: Payer: Self-pay | Admitting: Orthopedic Surgery

## 2018-10-30 VITALS — Ht 68.0 in | Wt 168.0 lb

## 2018-10-30 DIAGNOSIS — G8929 Other chronic pain: Secondary | ICD-10-CM

## 2018-10-30 DIAGNOSIS — M25512 Pain in left shoulder: Secondary | ICD-10-CM

## 2018-10-30 DIAGNOSIS — M792 Neuralgia and neuritis, unspecified: Secondary | ICD-10-CM | POA: Diagnosis not present

## 2018-10-30 DIAGNOSIS — M5441 Lumbago with sciatica, right side: Secondary | ICD-10-CM | POA: Diagnosis not present

## 2018-10-30 DIAGNOSIS — M7542 Impingement syndrome of left shoulder: Secondary | ICD-10-CM | POA: Diagnosis not present

## 2018-10-30 DIAGNOSIS — M542 Cervicalgia: Secondary | ICD-10-CM | POA: Diagnosis not present

## 2018-10-30 MED ORDER — PREDNISONE 10 MG PO TABS: 10.0000 mg | ORAL_TABLET | Freq: Every day | ORAL | 1 refills | Status: DC

## 2018-10-30 MED ORDER — METHYLPREDNISOLONE ACETATE 40 MG/ML IJ SUSP
40.0000 mg | INTRAMUSCULAR | Status: AC | PRN
  Administered 2018-10-30: 40 mg via INTRA_ARTICULAR

## 2018-10-30 MED ORDER — LIDOCAINE HCL 1 % IJ SOLN
5.0000 mL | INTRAMUSCULAR | Status: AC | PRN
  Administered 2018-10-30: 15:00:00 5 mL
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
## 2018-11-08 ENCOUNTER — Telehealth: Payer: Self-pay | Admitting: *Deleted

## 2018-11-08 NOTE — Telephone Encounter (Signed)
I called pt.  I left a message on his voicemail to call us back in reference to a recent visit he had at [facility].  Call [phone].

## 2018-11-10 NOTE — Telephone Encounter (Signed)
Patient returned call, advised of the potential exposure to COVID-19 at OV on 10/30/18 at [facility] and free testing is being offered if agreeable. He declines at this time, saying he doesn't have any symptoms and feeling fine. I advised of the number to call if he changes his mind, [phone] between 0700-1900 Monday through Friday, he verbalized understanding.

## 2018-11-22 ENCOUNTER — Other Ambulatory Visit: Payer: Self-pay

## 2018-11-22 ENCOUNTER — Ambulatory Visit
Admission: RE | Admit: 2018-11-22 | Discharge: 2018-11-22 | Disposition: A | Discharge status: Home / Self Care | Payer: 59 | Source: Ambulatory Visit | Attending: Orthopedic Surgery | Admitting: Orthopedic Surgery

## 2018-11-22 DIAGNOSIS — M792 Neuralgia and neuritis, unspecified: Secondary | ICD-10-CM

## 2018-11-27 ENCOUNTER — Ambulatory Visit (INDEPENDENT_AMBULATORY_CARE_PROVIDER_SITE_OTHER): Payer: 59 | Admitting: Physician Assistant

## 2018-11-27 ENCOUNTER — Other Ambulatory Visit: Payer: Self-pay

## 2018-11-27 ENCOUNTER — Encounter: Payer: Self-pay | Admitting: Orthopedic Surgery

## 2018-11-27 VITALS — Ht 68.0 in | Wt 168.0 lb

## 2018-11-27 DIAGNOSIS — M5441 Lumbago with sciatica, right side: Secondary | ICD-10-CM | POA: Diagnosis not present

## 2018-11-27 DIAGNOSIS — M792 Neuralgia and neuritis, unspecified: Secondary | ICD-10-CM

## 2018-11-27 DIAGNOSIS — M542 Cervicalgia: Secondary | ICD-10-CM | POA: Diagnosis not present

## 2018-11-27 DIAGNOSIS — M7542 Impingement syndrome of left shoulder: Secondary | ICD-10-CM

## 2018-11-27 DIAGNOSIS — G8929 Other chronic pain: Secondary | ICD-10-CM

## 2018-11-27 MED ORDER — METHOCARBAMOL 500 MG PO TABS: 500.0000 mg | ORAL_TABLET | Freq: Four times a day (QID) | ORAL | 2 refills | Status: DC | PRN

## 2018-11-27 NOTE — Progress Notes (Signed)
Office Visit Note   Patient: Ricky Adams           Date of Birth: Jun 07, 1986           MRN: [id number] Visit Date: 11/27/2018              Requested by: Dois Davenportichter, Karen L, MD [address] [place],  [place] [id number] PCP: Dois Davenportichter, Karen L, MD  Chief Complaint  Patient presents with  . Neck - Follow-up    MRI review       HPI: The patient is a 33 year old gentleman who is seen for follow-up of several issues.  #1 neck pain with some radicular symptoms down the left lower extremity #2 impingement of the left shoulder and #3 right-sided low back pain with sciatica of the right lower extremity. The patient reports today that his neck pain is about the same.  He was placed on oral prednisone and reports that this has not made much difference with the neck or the sciatic symptoms.  He did undergo a steroid injection to his left shoulder and reports that this did help with the shoulder pain specifically.  He is otherwise taking some ibuprofen to try to help with the discomfort.  He is not taking any medications for muscle spasms but does report he feels very tight through his shoulders and neck at times.  He does do heavy work with his job. A MRI scan was reviewed today of his cervical spine as well and does show some mild noncompressive disc bulging at C3-4 through C6-7 without significant spinal stenosis.  There is mild bilateral C4 and right C5 foraminal narrowing related to disc bulge and on uncovertebral hypertrophy.  There is no other significant foraminal encroachment within the cervical spine.  He does have straightening of the normal cervical lordosis without listhesis or malalignment.   Assessment & Plan: Visit Diagnoses:  1. Chronic right-sided low back pain with right-sided sciatica   2. Neck pain   3. Radicular pain in left arm   4. Impingement syndrome of left shoulder     Plan: The patient was [facility] to [facility] his cervical  radicular symptoms and neck pain and right-sided sciatica.  We also discussed that he could try some Robaxin to help with some of his muscle spasms in the cervical spine.  We also discussed utilizing some Voltaren gel which is now over-the-counter for pain management. He will start with physical therapy but if he should continue to have persistent symptoms may need evaluation by physical medicine rehab for possible steroid injection.  Follow-Up Instructions: Return in about 6 weeks (around 01/08/2019).   Ortho Exam  Patient is alert, oriented, no adenopathy, well-dressed, normal affect, normal respiratory effort. Patient ambulates with a normal-appearing gait.  He has a negative straight leg raise on the right.  He has no focal motor weakness and no focal sensory deficit.  Examination of the upper extremity shows good strength and equal strength throughout both upper extremities.  He has some mild residual pain of the left shoulder but full range of motion.  Negative impingement negative drop arm.  He is tender to palpation over the trapezius muscles bilaterally and over the paraspinals over the cervical spine.  Imaging: No results found. No images are attached to the encounter.  Labs: No results found for: HGBA1C, ESRSEDRATE, CRP, LABURIC, REPTSTATUS, GRAMSTAIN, CULT, LABORGA   No results found for: ALBUMIN, PREALBUMIN, LABURIC  Body mass index  is 25.54 kg/m.  Orders:  No orders of the defined types were placed in this encounter.  Meds ordered this encounter  Medications  . methocarbamol (ROBAXIN) 500 MG tablet    Sig: Take 1 tablet (500 mg total) by mouth 4 (four) times daily as needed for muscle spasms.    Dispense:  60 tablet    Refill:  2     Procedures: No procedures performed  Clinical Data: No additional findings.  ROS:  All other systems negative, except as noted in the HPI. Review of Systems  Objective: Vital Signs: Ht 5\' 8"  (1.727 m)   Wt 168 lb (76.2 kg)    BMI 25.54 kg/m   Specialty Comments:  No specialty comments available.  PMFS History: There are no active problems to display for this patient.  Past Medical History:  Diagnosis Date  . GERD (gastroesophageal reflux disease)   . History of concussion    01-24-2018  per pt x3 , last one 2005,  no residuals  . 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 Wears contact lenses     History reviewed. No pertinent family history.  Past Surgical History:  Procedure Laterality Date  . ANKLE FUSION Left 01/11/2015   Procedure: Excision Left Subtalar Bar;  Surgeon: Newt Minion, MD;  Location: [facility];  Service: Orthopedics;  Laterality: Left;  . ORCHIOPEXY Bilateral child   for undescended testis  . TONSILLECTOMY  child  . VASECTOMY Bilateral 01/31/2018   Procedure: VASECTOMY;  Surgeon: Cleon Gustin, MD;  Location: [facility];  Service: Urology;  Laterality: Bilateral;  . WISDOM TOOTH EXTRACTION  teen   Social History   Occupational History  . Not on file  Tobacco Use  . Smoking status: Never Smoker  . Smokeless tobacco: Never Used  Substance and Sexual Activity  . Alcohol use: Yes    Comment: social  . Drug use: Never  . Sexual activity: Yes

## 2019-01-08 ENCOUNTER — Ambulatory Visit: Payer: 59 | Admitting: Physician Assistant

## 2019-12-03 IMAGING — MR MRI CERVICAL SPINE WITHOUT CONTRAST
4 of 5 series · 28 of 48 positions shown · non-contrast
Comparison: Prior radiograph from 10/30/2018.

CLINICAL DATA: Initial evaluation for left arm pain and numbness.

EXAM:
MRI CERVICAL SPINE WITHOUT CONTRAST
TECHNIQUE: Multiplanar, multisequence MR imaging of the cervical spine was
performed. No intravenous contrast was administered.

[Series 2: T2 · sagittal · 3.0mm · 0.41mm/px · 7 of 13 slices shown (1 of 2)]
[im 1/13]
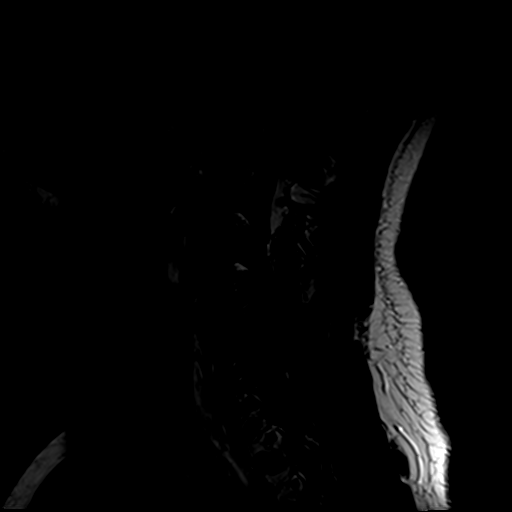
[im 3/13]
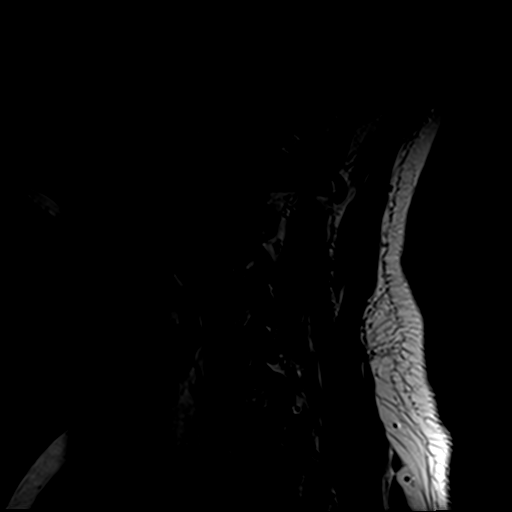
[im 5/13]
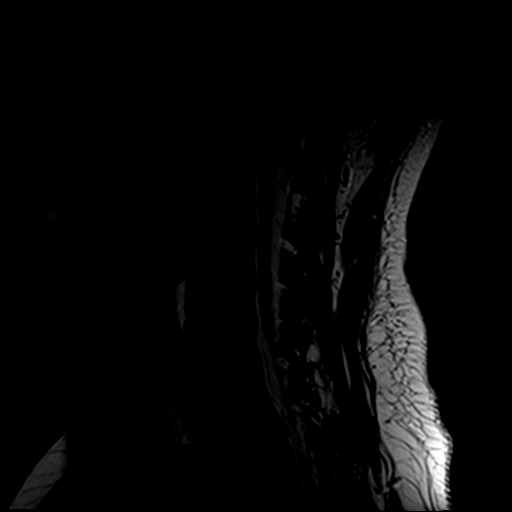
[im 7/13]
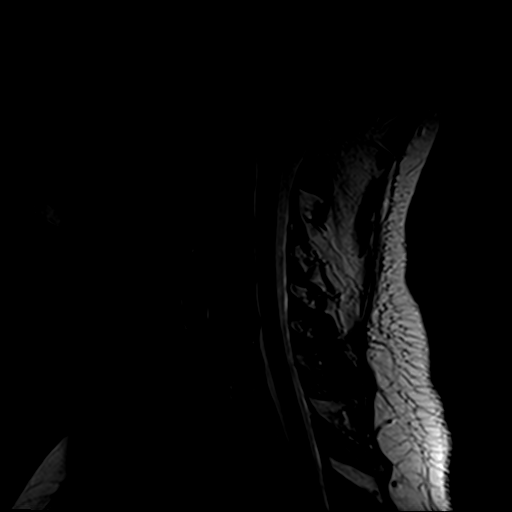
[im 9/13]
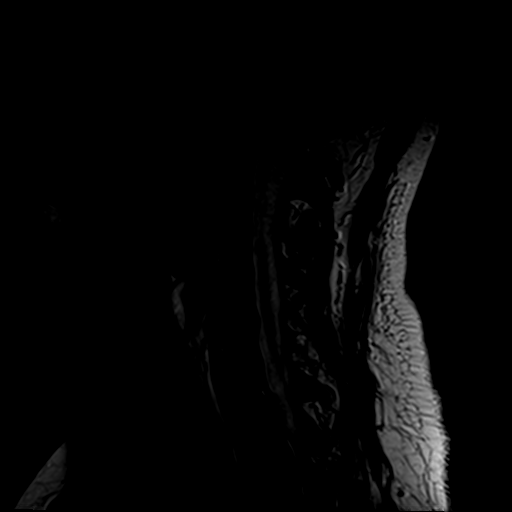
[im 11/13]
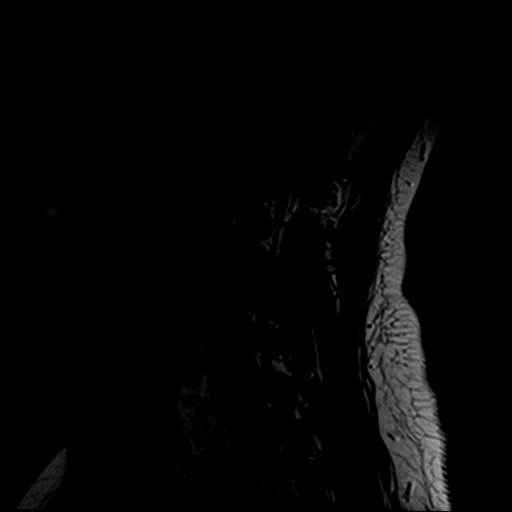
[im 13/13]
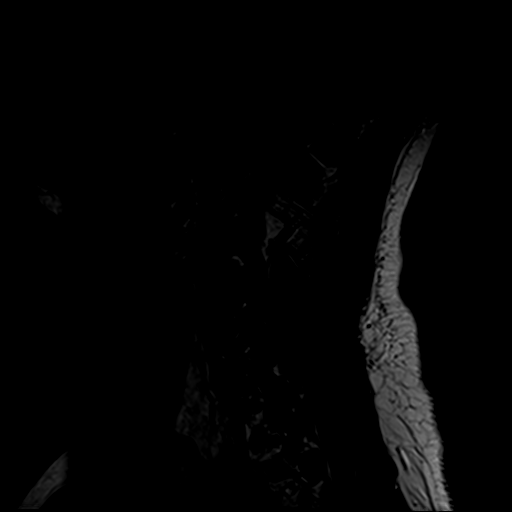

[Series 3: T1 · sagittal · 3.0mm · 0.41mm/px · 7 of 13 slices shown]
[im 1/13]
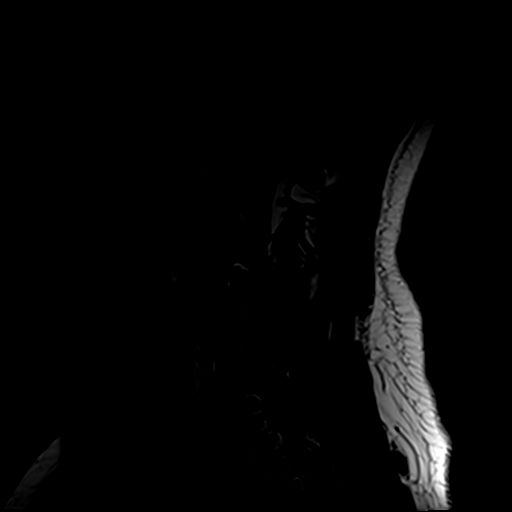
[im 3/13]
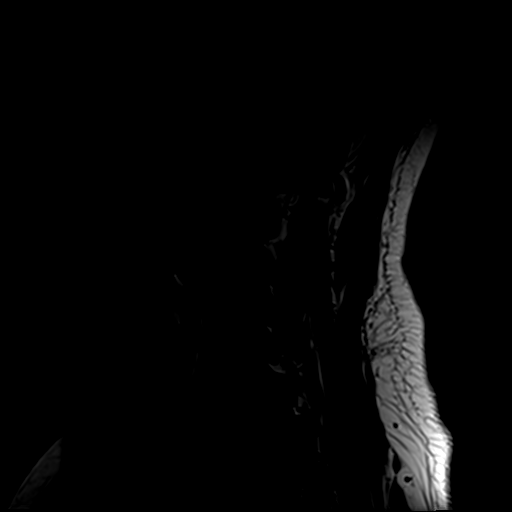
[im 5/13]
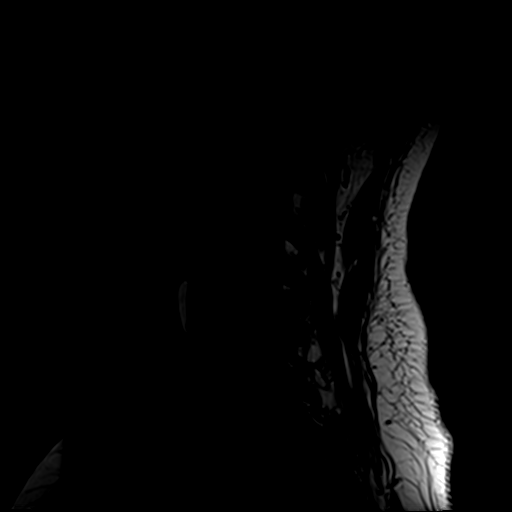
[im 7/13]
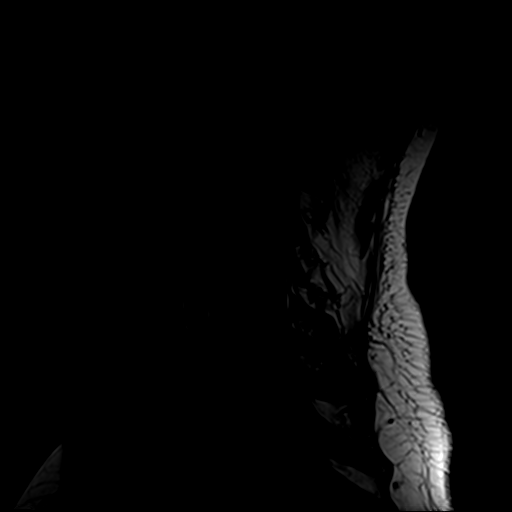
[im 9/13]
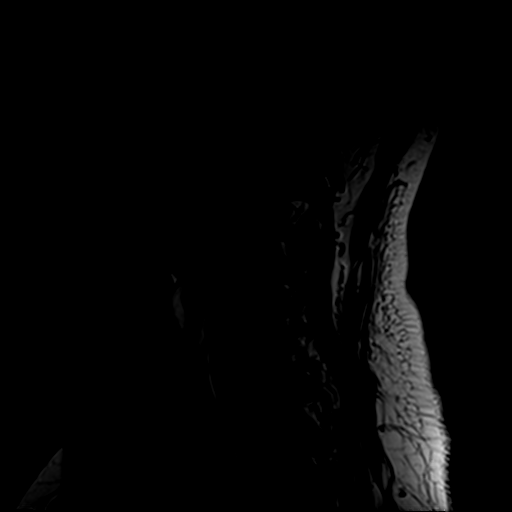
[im 11/13]
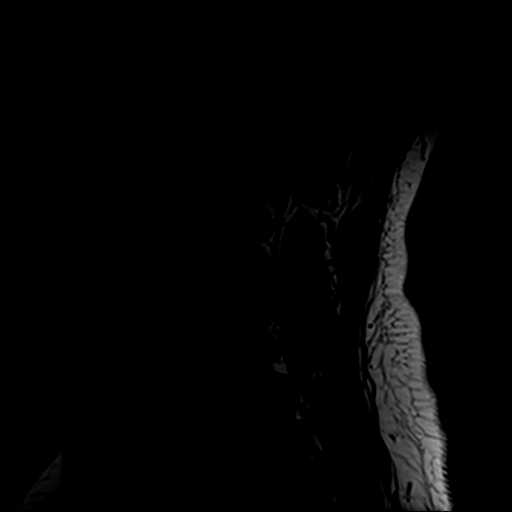
[im 13/13]
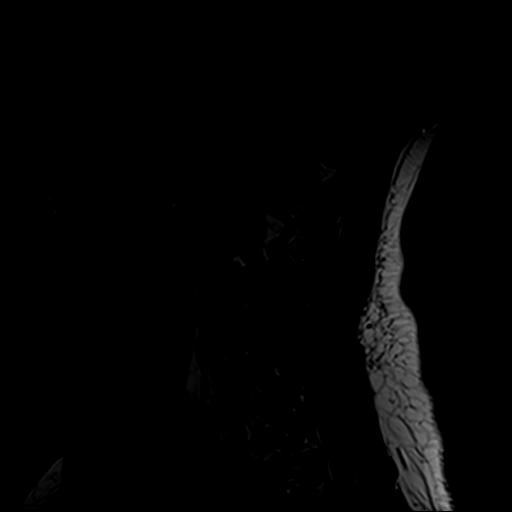

[Series 4: STIR · sagittal · 3.0mm · 0.82mm/px · 6 of 13 slices shown]
[im 1/13]
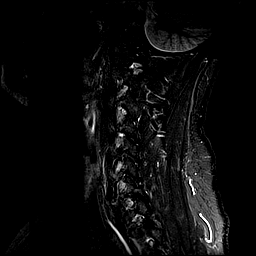
[im 3/13]
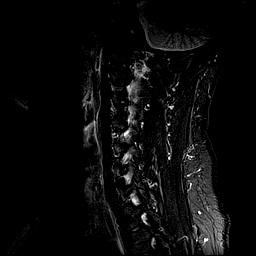
[im 5/13]
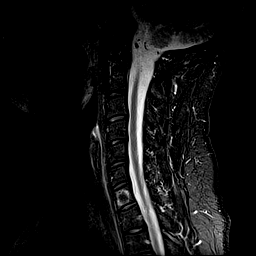
[im 8/13]
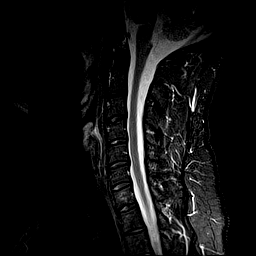
[im 10/13]
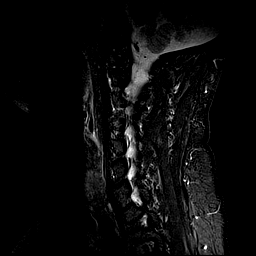
[im 13/13]
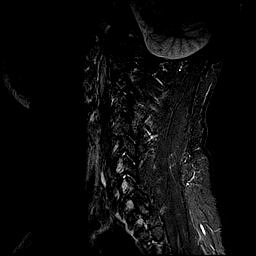

[Series 6: T2 · axial · 3.0mm · 0.70mm/px · z∈[-76,+26]mm · 8 of 29 slices shown (2 of 2)]
[im 1/29]
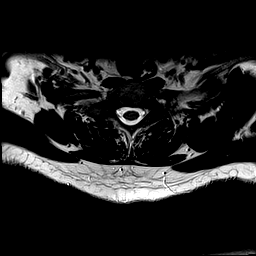
[im 5/29]
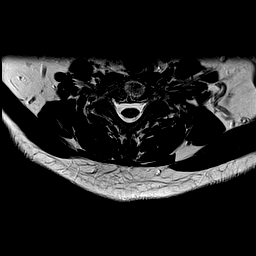
[im 9/29]
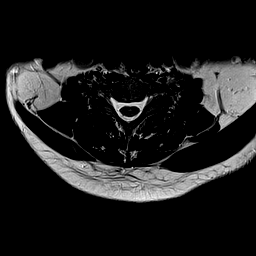
[im 13/29]
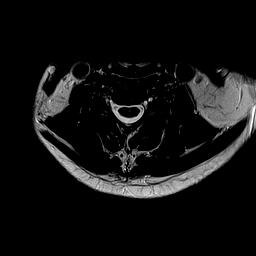
[im 16/29]
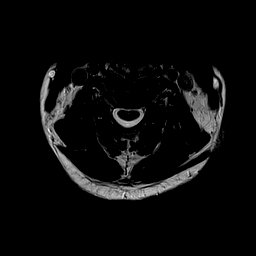
[im 20/29]
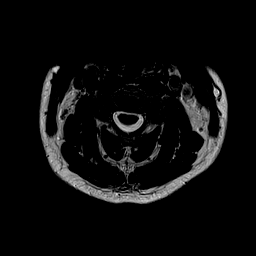
[im 24/29]
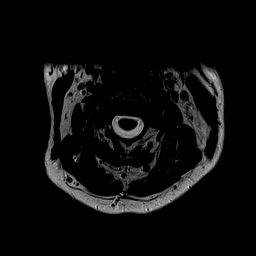
[im 29/29]
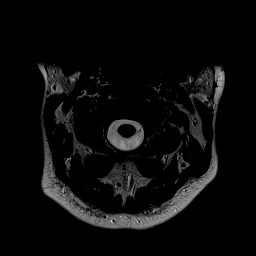

[28 of 48 positions shown; findings below may reference images not displayed]

FINDINGS: Alignment: Straightening of the normal cervical lordosis without
listhesis or malalignment.

Vertebrae: Vertebral body height maintained without evidence for
acute or chronic fracture. Underlying bone marrow signal intensity
within normal limits. Atypical hemangioma measuring 12 mm present
within the C7 vertebral body. No other discrete or worrisome osseous
lesions. No other abnormal marrow edema.

Cord: Signal intensity within the cervical spinal cord is normal.

Posterior Fossa, vertebral arteries, paraspinal tissues: Visualized
brain and posterior fossa within normal limits. Craniocervical
junction normal. Paraspinous and prevertebral soft tissues within
normal limits. Normal intravascular flow voids seen within the
vertebral arteries bilaterally.

Disc levels:

C2-C3: Unremarkable.

C3-C4: Mild disc bulge with uncovertebral hypertrophy. Central canal
widely patent. Mild bilateral C4 foraminal stenosis, left greater
than right.

C4-C5: Mild disc bulge with uncovertebral hypertrophy. Central canal
widely patent. Mild right C5 foraminal stenosis. No significant left
foraminal encroachment.

C5-C6: Mild disc bulge with uncovertebral hypertrophy. No
significant spinal stenosis. Foramina remain widely patent.

C6-C7: Minor annular disc bulge. No significant canal or foraminal
stenosis.

C7-T1:  Unremarkable.

Visualized upper thoracic spine demonstrates no significant finding.
IMPRESSION: 1. Mild noncompressive disc bulging at C3-4 through C6-7 without
significant spinal stenosis.
2. Mild bilateral C4 and right C5 foraminal narrowing related to
disc bulge and uncovertebral hypertrophy. No other significant
foraminal encroachment within the cervical spine.

## 2022-01-09 ENCOUNTER — Ambulatory Visit (INDEPENDENT_AMBULATORY_CARE_PROVIDER_SITE_OTHER): Payer: BC Managed Care – PPO | Admitting: Behavioral Health

## 2022-01-09 DIAGNOSIS — F4323 Adjustment disorder with mixed anxiety and depressed mood: Secondary | ICD-10-CM

## 2022-01-09 DIAGNOSIS — Z789 Other specified health status: Secondary | ICD-10-CM

## 2022-01-09 NOTE — Progress Notes (Signed)
                Jeanean Hollett L Tiera Mensinger, LMFT 

## 2022-01-19 DIAGNOSIS — F411 Generalized anxiety disorder: Secondary | ICD-10-CM | POA: Insufficient documentation

## 2022-02-01 NOTE — Progress Notes (Signed)
[facility] Counselor Initial Adult Exam  Name: Ricky Adams Date: 02/01/2022 MRN: [id number] DOB: 06/25/1985 PCP: Dois Davenport, MD  Time spent: 60 min on telephone w/Pt in his truck @ work in private/Clinician @ [facility] on telephoneSelf  Guardian/Payee:  Self    Paperwork requested: No   Reason for Visit /Presenting Problem: Pt is stressed in marital relationship currently. Pt is trying to moderate - quit his use of ETOH. Young Family G & D stressors. Parents are currently sleeping apart @ night & this adds stress/anx/dep for Pt.   Mental Status Exam: Appearance:   NA     Behavior:  Appropriate  Motor:  NA  Speech/Language:   Normal Rate  Affect:  Appropriate  Mood:  anxious  Thought process:  normal  Thought content:    WNL  Sensory/Perceptual disturbances:    WNL  Orientation:  oriented to person, place, and time/date  Attention:  Good  Concentration:  Good, but pre-occupied w/the stressors while @ work  Memory:  [employer] of knowledge:   Good  Insight:    Good  Judgment:   Good  Impulse Control:  Good    Reported Symptoms:  elevated anx/dep & marital distress due to his consumption of ETOH in the past few yrs that have stressed his Wife & children. Housing situation became unstable & this was further stress on marriage. Pt has experience an anx attack & neg sleep patterns. He has noticed sporadic anger issues when he is home.   Risk Assessment: Danger to Self:  No Self-injurious Behavior: No Danger to Others: No Duty to Warn:no Physical Aggression / Violence:No  Access to Firearms a concern: No  Gang Involvement:No  Patient / guardian was educated about steps to take if suicide or homicide risk level increases between visits: yes; Pt given appropriate resources While future psychiatric events cannot be accurately predicted, the patient does not currently require acute inpatient psychiatric care and does not currently meet [facility]  involuntary commitment criteria.  Substance Abuse History: Current substance abuse: Yes  ; ETOH consumptions   Past Psychiatric History:   No previous psychological problems have been observed Outpatient Providers: Dr. Nadyne Coombes, MD History of Psych Hospitalization: No  Psychological Testing:  None reported today    Abuse History:  Victim of: No.,  NA    Report needed: No. Victim of Neglect:No. Perpetrator of  NA   Witness / Exposure to Domestic Violence: No   Protective Services Involvement: No  Witness to [employer] Violence:  No   Family History: Pt & Wife Danielle have 3 children; 3yo Luther, 5yo [place], & 7yo Adaline. Family [place] Pt's [address] [place] in [place] beside his own Parents.  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: No   Educational History: Education: some college  Religion/Sprituality/World View: Family attends a Sales executive w/lots energetic young ppl; Pt was raised in more traditional, religious [place] environment   Any cultural differences that may affect / interfere with treatment:  Parents do not seem to object to Pt & Wife's new choices for their Family  Recreation/Hobbies: Loves to travel & spend quality time w/Family  Stressors: Marital or family conflict  ; Pt is aware of his ETOH use that has stressed his Wife out & realizes he needs to change his beh  Strengths: Supportive Relationships, Family, Friends, Church, Spirituality, and Able to Communicate Effectively  Barriers:  None noted today; possibility Pt is in denial about ETOH & its true impact on Families   Legal  History: Pending legal issue / charges: The patient has no significant history of legal issues. 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   s/p  surgery (orchiopexy)   Wears contact lenses     Past Surgical History:  Procedure Laterality Date   ANKLE FUSION Left 01/11/2015   Procedure: Excision Left Subtalar Bar;  Surgeon: Nadara Mustard, MD;  Location: [facility] OR;  Service: Orthopedics;  Laterality: Left;   ORCHIOPEXY Bilateral child   for undescended testis   TONSILLECTOMY  child   VASECTOMY Bilateral 01/31/2018   Procedure: VASECTOMY;  Surgeon: Malen Gauze, MD;  Location: [facility];  Service: Urology;  Laterality: Bilateral;   WISDOM TOOTH EXTRACTION  teen    Medications: Current Outpatient Medications  Medication Sig Dispense Refill   calcium carbonate (TUMS - DOSED IN MG ELEMENTAL CALCIUM) 500 MG chewable tablet Chew 1 tablet by mouth as needed for indigestion or heartburn.     divalproex (DEPAKOTE) 500 MG DR tablet Take 500 mg by mouth every morning.     methocarbamol (ROBAXIN) 500 MG tablet Take 1 tablet (500 mg total) by mouth 4 (four) times daily as needed for muscle spasms. 60 tablet 2   predniSONE (DELTASONE) 10 MG tablet Take 1 tablet (10 mg total) by mouth daily with breakfast. 30 tablet 1   No current facility-administered medications for this visit.    No Known Allergies  Diagnoses:  Adjustment disorder with mixed anxiety and depressed mood  Admits to alcohol consumption  Plan of Care: Pt will cont to attend Indiv psychotherapy until plans are revised to Cpl Th when indicated  Pt plan for moderation of consumptions reviewed   Deneise Lever, LMFT

## 2022-03-01 ENCOUNTER — Ambulatory Visit (HOSPITAL_BASED_OUTPATIENT_CLINIC_OR_DEPARTMENT_OTHER): Payer: 59 | Admitting: Nurse Practitioner

## 2023-02-18 ENCOUNTER — Ambulatory Visit (INDEPENDENT_AMBULATORY_CARE_PROVIDER_SITE_OTHER): Payer: BC Managed Care – PPO

## 2023-02-18 ENCOUNTER — Encounter: Payer: Self-pay | Admitting: Podiatry

## 2023-02-18 ENCOUNTER — Ambulatory Visit (INDEPENDENT_AMBULATORY_CARE_PROVIDER_SITE_OTHER): Payer: BC Managed Care – PPO | Admitting: Podiatry

## 2023-02-18 DIAGNOSIS — M2021 Hallux rigidus, right foot: Secondary | ICD-10-CM | POA: Diagnosis not present

## 2023-02-18 DIAGNOSIS — B070 Plantar wart: Secondary | ICD-10-CM

## 2023-02-18 NOTE — Patient Instructions (Signed)
Keep the bandage on for 24 hours. At that time, remove and clean with soap and water. If it hurts or burns before 24 hours go ahead and remove the bandage and wash with soap and water. Keep the area clean. If there is any blistering cover with antibiotic ointment and a bandage. Monitor for any redness, drainage, or other signs of infection. Call the office if any are to occur. If you have any questions, please call the office at [phone].  

## 2023-02-18 NOTE — Progress Notes (Signed)
Subjective:   Patient ID: Ricky Adams, male   DOB: 36 y.o.   MRN: [id number]   HPI Chief Complaint  Patient presents with   Callouses    Right 5th toe possible wart/callus on the side of toe    37 year old male presents the office today with concerns of pain to the right fifth toe, skin lesion.  Denies any recent injuries or any drainage or pus or any swelling or redness.  He also has secondary concerns of pain to his right big toe is not able to bend the toe as much.  This has been more of a chronic issue for him.  Hurts with try to bend the toe.  No other concerns today.  Review of Systems  All other systems reviewed and are negative.  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   s/p  surgery (orchiopexy)   Wears contact lenses     Past Surgical History:  Procedure Laterality Date   ANKLE FUSION Left 01/11/2015   Procedure: Excision Left Subtalar Bar;  Surgeon: Nadara Mustard, MD;  Location: [facility] OR;  Service: Orthopedics;  Laterality: Left;   ORCHIOPEXY Bilateral child   for undescended testis   TONSILLECTOMY  child   VASECTOMY Bilateral 01/31/2018   Procedure: VASECTOMY;  Surgeon: Malen Gauze, MD;  Location: [facility];  Service: Urology;  Laterality: Bilateral;   WISDOM TOOTH EXTRACTION  teen     Current Outpatient Medications:    escitalopram (LEXAPRO) 10 MG tablet, Take 1 tablet by mouth daily., Disp: , Rfl:    hydrOXYzine (ATARAX) 25 MG tablet, Take 1-2 tablets by mouth at bedtime as needed., Disp: , Rfl:   No Known Allergies        Objective:  Physical Exam  General: AAO x3, NAD  Dermatological: On the right fifth toe there is hyperkeratotic lesion present and there is pinpoint bleeding evidence of verruca.  There is no edema, erythema or signs of infection.  Vascular: Dorsalis  Pedis artery and Posterior Tibial artery pedal pulses are 2/4 bilateral with immedate capillary fill time. There is no pain with calf compression, swelling, warmth, erythema.   Neruologic: Grossly intact via light touch bilateral.   Musculoskeletal: Tenderness to right fifth toe skin lesion.  There is decreased range of motion of the first MTPJ dorsiflexion.  There is no other areas of pinpoint tenderness noted today.  Gait: Unassisted, Nonantalgic.       Assessment:   37 year old male skin lesion, likely verruca right fifth toe; hallux limitus     Plan:  -Treatment options discussed including all alternatives, risks, and complications -Etiology of symptoms were discussed -X-rays were obtained and reviewed with the patient.  3 views of the right foot were obtained.  No evidence of acute fracture.  There is dorsal spurring present at the first MTPJ on the lateral view. -In regards to the skin lesion sharply debrided this without any complications.  Cleaned the skin with alcohol.  Pad was placed followed by salicylic acid and a bandage.  Postprocedure instructions discussed.  Monitor for any signs or symptoms of infection. -For the hallux limitus we discussed shoe modifications and we also can consider custom orthotics with a reverse Morton's extension.  Anti-inflammatories as needed.  Supportive shoe gear.  Vivi Barrack DPM

## 2023-03-18 ENCOUNTER — Ambulatory Visit: Payer: BC Managed Care – PPO | Admitting: Podiatry
